# Patient Record
Sex: Male | Born: 1985 | Hispanic: No | Marital: Single | Smoking: Current some day smoker
Health system: Southern US, Community
[De-identification: ages and names within clinical notes are randomized; demographics above are authoritative.]

---

## 2012-04-21 ENCOUNTER — Encounter (HOSPITAL_COMMUNITY): Payer: Self-pay

## 2012-04-21 ENCOUNTER — Emergency Department (INDEPENDENT_AMBULATORY_CARE_PROVIDER_SITE_OTHER)
Admission: EM | Admit: 2012-04-21 | Discharge: 2012-04-21 | Disposition: A | Discharge status: Home / Self Care | Payer: BC Managed Care – PPO | Source: Home / Self Care | Attending: Emergency Medicine | Admitting: Emergency Medicine

## 2012-04-21 DIAGNOSIS — R05 Cough: Secondary | ICD-10-CM

## 2012-04-21 DIAGNOSIS — R0789 Other chest pain: Secondary | ICD-10-CM

## 2012-04-21 DIAGNOSIS — R071 Chest pain on breathing: Secondary | ICD-10-CM

## 2012-04-21 MED ORDER — DEXTROMETHORPHAN HBR 5 MG/5ML PO SYRP: 5.0000 mL | ORAL_SOLUTION | Freq: Two times a day (BID) | ORAL | Status: DC

## 2012-04-21 NOTE — ED Notes (Signed)
C/o cough and cold sx since yesterday.  Reports sharp intermittent lt sided chest pain that started around 5 am today.  Denies pain at present.

## 2012-04-21 NOTE — Discharge Instructions (Signed)
  Your lung and heart exam were unremarkable. Take this medicine  if you continue to cough. If you develop chest pains unrelated to cough or breathing should go to the emergency department immediately when you're having the pain     Chest Pain (Nonspecific) Chest pain has many causes. Your pain could be caused by something serious, such as a heart attack or a blood clot in the lungs. It could also be caused by something less serious, such as a chest bruise or a virus. Follow up with your doctor. More lab tests or other studies may be needed to find the cause of your pain. Most of the time, nonspecific chest pain will improve within 2 to 3 days of rest and mild pain medicine. HOME CARE  For chest bruises, you may put ice on the sore area for 15 to 20 minutes, 3 to 4 times a day. Do this only if it makes you or your child feel better.   Put ice in a plastic bag.   Place a towel between the skin and the bag.   Rest for the next 2 to 3 days.   Go back to work if the pain improves.   See your doctor if the pain lasts longer than 1 to 2 weeks.   Only take medicine as told by your doctor.   Quit smoking if you smoke.  GET HELP RIGHT AWAY IF:   There is more pain or pain that spreads to the arm, neck, jaw, back, or belly (abdomen).   You or your child has shortness of breath.   You or your child coughs more than usual or coughs up blood.   You or your child has very bad back or belly pain, feels sick to his or her stomach (nauseous), or throws up (vomits).   You or your child has very bad weakness.   You or your child passes out (faints).   You or your child has a temperature by mouth above 102 F (38.9 C), not controlled by medicine.  Any of these problems may be serious and may be an emergency. Do not wait to see if the problems will go away. Get medical help right away. Call your local emergency services 911 in [place] Do not drive yourself to the hospital. MAKE SURE YOU:    Understand these instructions.   Will watch this condition.   Will get help right away if you or your child is not doing well or gets worse.  Document Released: 05/27/2008 Document Revised: 11/28/2011 Document Reviewed: 05/27/2008 [facility] Patient Information 2012 [place], [place].

## 2012-04-21 NOTE — ED Provider Notes (Signed)
History     CSN: [id number]  Arrival date & time 04/21/12  1003   First MD Initiated Contact with Patient 04/21/12 1019      Chief Complaint  Patient presents with  . Cough  . Chest Pain    (Consider location/radiation/quality/duration/timing/severity/associated sxs/prior treatment) HPI Comments: Patient presents to urgent care today complaining that this morning he had left-sided chest pain and that he has been coughing since yesterday. He says that when he woke up today at 5 AM he felt this discomfort in the left side of his chest. Taking a deep breath and movement seemed to make it worse but also he felt that at rest. Sore faded away he did not take anything for it it lasted a couple minutes. No further associated symptoms such as nausea, vomiting sweating, dizziness.  Patient describes that he was coughing yesterday heart and try. But that has subsided and he did not take anything for it. He also is requesting a doctor's note for this morning as he did not go to work because of this pain.  Patient is a 26 y.o. male presenting with cough and chest pain.  Cough The current episode started yesterday. The problem has been resolved. The cough is non-productive. Associated symptoms include chest pain. Pertinent negatives include no sweats, no weight loss, no ear congestion, no ear pain, no rhinorrhea, no sore throat, no shortness of breath and no wheezing. He has tried nothing for the symptoms. His past medical history does not include COPD or asthma.  Chest Pain Primary symptoms include cough. Pertinent negatives for primary symptoms include no fever, no fatigue, no shortness of breath, no wheezing and no dizziness.  Pertinent negatives for associated symptoms include no diaphoresis.     History reviewed. No pertinent past medical history.  History reviewed. No pertinent past surgical history.  History reviewed. No pertinent family history.  History  Substance Use Topics  .  Smoking status: Never Smoker   . Smokeless tobacco: Not on file  . Alcohol Use: Yes      Review of Systems  Constitutional: Negative for fever, weight loss, diaphoresis, activity change, appetite change and fatigue.  HENT: Negative for ear pain, sore throat and rhinorrhea.   Respiratory: Positive for cough. Negative for apnea, choking, chest tightness, shortness of breath and wheezing.   Cardiovascular: Positive for chest pain.  Skin: Negative for color change, pallor, rash and wound.  Neurological: Negative for dizziness.    Allergies  Review of patient's allergies indicates no known allergies.  Home Medications   Current Outpatient Rx  Name Route Sig Dispense Refill  . DEXTROMETHORPHAN HBR 5 MG/5ML PO SYRP Oral Take 5 mLs (5 mg total) by mouth 2 times daily at 12 noon and 4 pm. 120 mL 0    BP 115/69  Pulse 88  Temp(Src) 98.5 F (36.9 C) (Oral)  Resp 14  SpO2 100%  Physical Exam  Nursing note and vitals reviewed. Constitutional: He appears well-developed and well-nourished.  HENT:  Head: Normocephalic.  Eyes: Conjunctivae are normal.  Neck: Neck supple.  Cardiovascular: Normal rate, regular rhythm, normal heart sounds and intact distal pulses.  Exam reveals no gallop.   No murmur heard. Pulmonary/Chest: Effort normal and breath sounds normal. No respiratory distress. He has no wheezes. He has no rhonchi. He has no rales. He exhibits tenderness.    Skin: No rash noted.    ED Course  Procedures (including critical care time)  Labs Reviewed - No data to display No  results found.   1. Chest wall discomfort   2. Cough       MDM  Patient is asymptomatic during exam denies any current symptoms. Described that yesterday was coughing and having chest in term attempt discomfort in his left parasternal area. He was sore on his chest this morning when he woke up. Patient very motivated to obtain a work note for today and expressed multiple times that he has no pain  now and the cough has subsided. On exam patient was mildly tender on his right and left upper chest wall area. As depicted in illustration.        Jimmie Molly, MD 04/21/12 [phone]

## 2012-06-01 ENCOUNTER — Emergency Department (INDEPENDENT_AMBULATORY_CARE_PROVIDER_SITE_OTHER)
Admission: EM | Admit: 2012-06-01 | Discharge: 2012-06-01 | Disposition: A | Discharge status: Home / Self Care | Payer: BC Managed Care – PPO | Source: Home / Self Care | Attending: Family Medicine | Admitting: Family Medicine

## 2012-06-01 ENCOUNTER — Encounter (HOSPITAL_COMMUNITY): Payer: Self-pay | Admitting: *Deleted

## 2012-06-01 DIAGNOSIS — L089 Local infection of the skin and subcutaneous tissue, unspecified: Secondary | ICD-10-CM

## 2012-06-01 MED ORDER — SULFAMETHOXAZOLE-TRIMETHOPRIM 800-160 MG PO TABS: 1.0000 | ORAL_TABLET | Freq: Two times a day (BID) | ORAL | Status: AC

## 2012-06-01 NOTE — ED Notes (Signed)
C/O "boil" to right groin since yesterday - "it was really big, but it's getting better".  Also c/o pruritic rash to bilat lower legs since yesterday morning - has appearance of scabbed bug bites.

## 2012-06-01 NOTE — Discharge Instructions (Signed)
Skin Infections A skin infection usually develops as a result of disruption of the skin barrier.  CAUSES  A skin infection might occur following:  Trauma or an injury to the skin such as a cut or insect sting.   Inflammation (as in eczema).   Breaks in the skin between the toes (as in athlete's foot).   Swelling (edema).  SYMPTOMS  The legs are the most common site affected. Usually there is:  Redness.   Swelling.   Pain.   There may be red streaks in the area of the infection.  TREATMENT   Minor skin infections may be treated with topical antibiotics, but if the skin infection is severe, hospital care and intravenous (IV) antibiotic treatment may be needed.   Most often skin infections can be treated with oral antibiotic medicine as well as proper rest and elevation of the affected area until the infection improves.   If you are prescribed oral antibiotics, it is important to take them as directed and to take all the pills even if you feel better before you have finished all of the medicine.   You may apply warm compresses to the area for 20-30 minutes 4 times daily.  You might need a tetanus shot now if:  You have no idea when you had the last one.   You have never had a tetanus shot before.   Your wound had dirt in it.  If you need a tetanus shot and you decide not to get one, there is a rare chance of getting tetanus. Sickness from tetanus can be serious. If you get a tetanus shot, your arm may swell and become red and warm at the shot site. This is common and not a problem. SEEK MEDICAL CARE IF:  The pain and swelling from your infection do not improve within 2 days.  SEEK IMMEDIATE MEDICAL CARE IF:  You develop a fever, chills, or other serious problems.  Document Released: 01/16/2005 Document Revised: 11/28/2011 Document Reviewed: 11/28/2008 [employer] Patient Information 2012 [employer]. 

## 2012-06-01 NOTE — ED Provider Notes (Signed)
History     CSN: [id number]  Arrival date & time 06/01/12  1727   First MD Initiated Contact with Patient 06/01/12 1839      Chief Complaint  Patient presents with  . Abscess  . Rash    (Consider location/radiation/quality/duration/timing/severity/associated sxs/prior treatment) Patient is a 26 y.o. male presenting with abscess and rash. The history is provided by the patient. No language interpreter was used.  Abscess  This is a new problem. The current episode started less than one week ago. The onset was gradual. The problem occurs frequently. The problem has been gradually improving. The abscess is present on the left lower leg and right lower leg. The problem is moderate. The abscess is characterized by itchiness and redness.  Rash   Pt reports he was bitten by something.  Pt complains of multiple bites lower legs.  Pt reports he had a swollen knot in right groin area however knot has gone away  History reviewed. No pertinent past medical history.  History reviewed. No pertinent past surgical history.  No family history on file.  History  Substance Use Topics  . Smoking status: Never Smoker   . Smokeless tobacco: Not on file  . Alcohol Use: Yes     occasional      Review of Systems  Skin: Positive for rash.  All other systems reviewed and are negative.    Allergies  Review of patient's allergies indicates no known allergies.  Home Medications   Current Outpatient Rx  Name Route Sig Dispense Refill  . DEXTROMETHORPHAN HBR 5 MG/5ML PO SYRP Oral Take 5 mLs (5 mg total) by mouth 2 times daily at 12 noon and 4 pm. 120 mL 0    BP 131/89  Pulse 90  Temp(Src) 98 F (36.7 C) (Oral)  Resp 18  SpO2 100%  Physical Exam  Nursing note and vitals reviewed. Constitutional: He is oriented to person, place, and time. He appears well-developed and well-nourished.  HENT:  Head: Normocephalic and atraumatic.  Right Ear: External ear normal.  Left Ear: External ear  normal.  Eyes: Conjunctivae and EOM are normal. Pupils are equal, round, and reactive to light.  Neck: Normal range of motion. Neck supple.  Musculoskeletal: He exhibits tenderness.       Multiple bug bites  Neurological: He is alert and oriented to person, place, and time. He has normal reflexes.  Skin: Rash noted.       Multiple bites,  Erythema,  Pustules on center of some areas  Psychiatric: He has a normal mood and affect.    ED Course  Procedures (including critical care time)  Labs Reviewed - No data to display No results found.   No diagnosis found.    MDM  Rx bactrim Ds x 10 days.        Lonia Skinner [place], [place] 06/01/12 [phone]

## 2012-06-03 NOTE — ED Provider Notes (Signed)
Medical screening examination/treatment/procedure(s) were performed by non-physician practitioner and as supervising physician I was immediately available for consultation/collaboration.   [facility]; MD   Sharin Grave, MD 06/03/12 [phone]

## 2015-06-01 ENCOUNTER — Encounter (HOSPITAL_COMMUNITY): Payer: Self-pay | Admitting: Emergency Medicine

## 2015-06-01 ENCOUNTER — Emergency Department (INDEPENDENT_AMBULATORY_CARE_PROVIDER_SITE_OTHER)
Admission: EM | Admit: 2015-06-01 | Discharge: 2015-06-01 | Disposition: A | Discharge status: Home / Self Care | Payer: Self-pay | Source: Home / Self Care | Attending: Family Medicine | Admitting: Family Medicine

## 2015-06-01 ENCOUNTER — Emergency Department (INDEPENDENT_AMBULATORY_CARE_PROVIDER_SITE_OTHER): Payer: Self-pay

## 2015-06-01 DIAGNOSIS — R0781 Pleurodynia: Secondary | ICD-10-CM

## 2015-06-01 MED ORDER — NAPROXEN 500 MG PO TABS: 500.0000 mg | ORAL_TABLET | Freq: Two times a day (BID) | ORAL | Status: DC

## 2015-06-01 NOTE — Discharge Instructions (Signed)
Thank you for coming in today. Call or go to the emergency room if you get worse, have trouble breathing, have chest pains, or palpitations.  Take naproxen twice daily for pain as needed.   Chest Wall Pain Chest wall pain is pain in or around the bones and muscles of your chest. It may take up to 6 weeks to get better. It may take longer if you must stay physically active in your work and activities.  CAUSES  Chest wall pain may happen on its own. However, it may be caused by:  A viral illness like the flu.  Injury.  Coughing.  Exercise.  Arthritis.  Fibromyalgia.  Shingles. HOME CARE INSTRUCTIONS   Avoid overtiring physical activity. Try not to strain or perform activities that cause pain. This includes any activities using your chest or your abdominal and side muscles, especially if heavy weights are used.  Put ice on the sore area.  Put ice in a plastic bag.  Place a towel between your skin and the bag.  Leave the ice on for 15-20 minutes per hour while awake for the first 2 days.  Only take over-the-counter or prescription medicines for pain, discomfort, or fever as directed by your caregiver. SEEK IMMEDIATE MEDICAL CARE IF:   Your pain increases, or you are very uncomfortable.  You have a fever.  Your chest pain becomes worse.  You have new, unexplained symptoms.  You have nausea or vomiting.  You feel sweaty or lightheaded.  You have a cough with phlegm (sputum), or you cough up blood. MAKE SURE YOU:   Understand these instructions.  Will watch your condition.  Will get help right away if you are not doing well or get worse. Document Released: 12/09/2005 Document Revised: 03/02/2012 Document Reviewed: 08/05/2011 [facility] Patient Information 2015 [place], [place]. This information is not intended to replace advice given to you by your health care provider. Make sure you discuss any questions you have with your health care provider.

## 2015-06-01 NOTE — ED Provider Notes (Signed)
Robert Wolf is a 29 y.o. male who presents to Urgent Care today for left lateral chest pain present for one to 2 weeks. Pain is present in the lateral ribs and worse with deep inspiration cough and arm motion. He denies any injury. No fevers or chills nausea vomiting or diarrhea. No treatments tried yet. Patient works as a Financial risk analyst.    History reviewed. No pertinent past medical history. History reviewed. No pertinent past surgical history. History  Substance Use Topics  . Smoking status: Never Smoker   . Smokeless tobacco: Not on file  . Alcohol Use: Yes     Comment: occasional   ROS as above Medications: No current facility-administered medications for this encounter.   Current Outpatient Prescriptions  Medication Sig Dispense Refill  . Dextromethorphan HBr 5 MG/5ML SYRP Take 5 mLs (5 mg total) by mouth 2 times daily at 12 noon and 4 pm. 120 mL 0  . naproxen (NAPROSYN) 500 MG tablet Take 1 tablet (500 mg total) by mouth 2 (two) times daily. 30 tablet 0   No Known Allergies   Exam:  BP 125/78 mmHg  Pulse 81  Temp(Src) 99.1 F (37.3 C) (Oral)  Resp 16  SpO2 100% Gen: Well NAD HEENT: EOMI,  MMM Lungs: Normal work of breathing. CTABL Heart: RRR no MRG Chest wall: Tender palpation left lateral chest wall Abd: NABS, Soft. Nondistended, Nontender Exts: Brisk capillary refill, warm and well perfused.   No results found for this or any previous visit (from the past 24 hour(s)). Dg Ribs Unilateral W/chest Left  06/01/2015   CLINICAL DATA:  Left rib pain for weeks.  No known injury.  EXAM: LEFT RIBS AND CHEST - 3+ VIEW  COMPARISON:  None.  FINDINGS: No fracture or other bone lesions are seen involving the ribs. There is no evidence of pneumothorax or pleural effusion. Both lungs are clear. Heart size and mediastinal contours are within normal limits.  IMPRESSION: Negative.   Electronically Signed   By: Elige Ko   On: 06/01/2015 14:34    Assessment and Plan: 29 y.o. male with left  rib pain. Tx with naproxen and time. F/u PRN.   Discussed warning signs or symptoms. Please see discharge instructions. Patient expresses understanding.     Rodolph Bong, MD 06/01/15 [phone]

## 2015-06-01 NOTE — ED Notes (Signed)
Pt c/o left rib cage pain x1.5 weeks; last couple of days have pain has been worse Pain increases w/activity and deep breaths  Alert, no signs of acute distress.

## 2016-01-17 ENCOUNTER — Other Ambulatory Visit (HOSPITAL_COMMUNITY)
Admission: RE | Admit: 2016-01-17 | Discharge: 2016-01-17 | Disposition: A | Discharge status: Home / Self Care | Payer: Self-pay | Source: Ambulatory Visit | Attending: Family Medicine | Admitting: Family Medicine

## 2016-01-17 ENCOUNTER — Emergency Department (INDEPENDENT_AMBULATORY_CARE_PROVIDER_SITE_OTHER)
Admission: EM | Admit: 2016-01-17 | Discharge: 2016-01-17 | Disposition: A | Discharge status: Home / Self Care | Payer: Self-pay | Source: Home / Self Care | Attending: Family Medicine | Admitting: Family Medicine

## 2016-01-17 ENCOUNTER — Encounter (HOSPITAL_COMMUNITY): Payer: Self-pay | Admitting: *Deleted

## 2016-01-17 DIAGNOSIS — J111 Influenza due to unidentified influenza virus with other respiratory manifestations: Secondary | ICD-10-CM

## 2016-01-17 DIAGNOSIS — R69 Illness, unspecified: Principal | ICD-10-CM

## 2016-01-17 LAB — POCT RAPID STREP A: STREPTOCOCCUS, GROUP A SCREEN (DIRECT): NEGATIVE

## 2016-01-17 NOTE — ED Provider Notes (Signed)
CSN: [id number]     Arrival date & time 01/17/16  1354 History   First MD Initiated Contact with Patient 01/17/16 1551     Chief Complaint  Patient presents with  . Fever   (Consider location/radiation/quality/duration/timing/severity/associated sxs/prior Treatment) Patient is a 30 y.o. male presenting with fever. The history is provided by the patient.  Fever Temp source:  Subjective Severity:  Moderate Onset quality:  Sudden Duration:  1 day Progression:  Unchanged Chronicity:  New Relieved by:  None tried Worsened by:  Nothing tried Ineffective treatments:  None tried Associated symptoms: chills, congestion, cough, myalgias, nausea, rhinorrhea, sore throat and vomiting   Associated symptoms: no diarrhea and no ear pain   Risk factors: no recent travel     History reviewed. No pertinent past medical history. History reviewed. No pertinent past surgical history. History reviewed. No pertinent family history. Social History  Substance Use Topics  . Smoking status: Never Smoker   . Smokeless tobacco: None  . Alcohol Use: Yes     Comment: occasional    Review of Systems  Constitutional: Positive for fever, chills, appetite change and fatigue.  HENT: Positive for congestion, rhinorrhea and sore throat. Negative for ear pain.   Respiratory: Positive for cough. Negative for shortness of breath and wheezing.   Cardiovascular: Negative.   Gastrointestinal: Positive for nausea and vomiting. Negative for abdominal pain, diarrhea, constipation and blood in stool.  Musculoskeletal: Positive for myalgias.  All other systems reviewed and are negative.   Allergies  Review of patient's allergies indicates no known allergies.  Home Medications   Prior to Admission medications   Medication Sig Start Date End Date Taking? Authorizing Provider  Dextromethorphan HBr 5 MG/5ML SYRP Take 5 mLs (5 mg total) by mouth 2 times daily at 12 noon and 4 pm. 04/21/12   Jimmie Molly, MD  naproxen  (NAPROSYN) 500 MG tablet Take 1 tablet (500 mg total) by mouth 2 (two) times daily. 06/01/15   Rodolph Bong, MD   Meds Ordered and Administered this Visit  Medications - No data to display  BP 110/70 mmHg  Pulse 88  Temp(Src) 100.2 F (37.9 C) (Oral)  Resp 18  SpO2 100% No data found.   Physical Exam  Constitutional: He is oriented to person, place, and time. He appears well-developed and well-nourished. No distress.  HENT:  Right Ear: Ear canal normal. Tympanic membrane is erythematous and bulging. Tympanic membrane mobility is abnormal.  Left Ear: Ear canal normal. Tympanic membrane is erythematous and bulging. Tympanic membrane mobility is abnormal.  Mouth/Throat: No oropharyngeal exudate.  Neck: Normal range of motion. Neck supple.  Pulmonary/Chest: Effort normal and breath sounds normal.  Abdominal: Soft. Bowel sounds are normal. He exhibits no distension. There is no tenderness. There is no rebound and no guarding.  Musculoskeletal: Normal range of motion.  Lymphadenopathy:    He has no cervical adenopathy.  Neurological: He is alert and oriented to person, place, and time.  Skin: Skin is warm and dry.  Nursing note and vitals reviewed.   ED Course  Procedures (including critical care time)  Labs Review Labs Reviewed  CULTURE, GROUP A STREP [facility])  POCT RAPID STREP A   Strep neg  Imaging Review No results found.   Visual Acuity Review  Right Eye Distance:   Left Eye Distance:   Bilateral Distance:    Right Eye Near:   Left Eye Near:    Bilateral Near:  MDM   1. Influenza-like illness    No orders of the defined types were placed in this encounter.       Linna Hoff, MD 01/19/16 2038

## 2016-01-17 NOTE — Discharge Instructions (Signed)
Drink plenty of fluids as discussed, ,  mucinex or delsym for cough. Return or see your doctor if further problems

## 2016-01-17 NOTE — ED Notes (Signed)
Pt  Reports  Symptoms  Of  Cough     Chills   Fever  And  Body  Aches         Vomited  Last  Pm

## 2016-01-20 LAB — CULTURE, GROUP A STREP (THRC)

## 2017-01-22 IMAGING — DX DG RIBS W/ CHEST 3+V*L*
3 series · 3 of 3 positions shown · non-contrast
Comparison: None.

CLINICAL DATA: Left rib pain for weeks.  No known injury.

EXAM:
LEFT RIBS AND CHEST - 3+ VIEW

[chest pa]
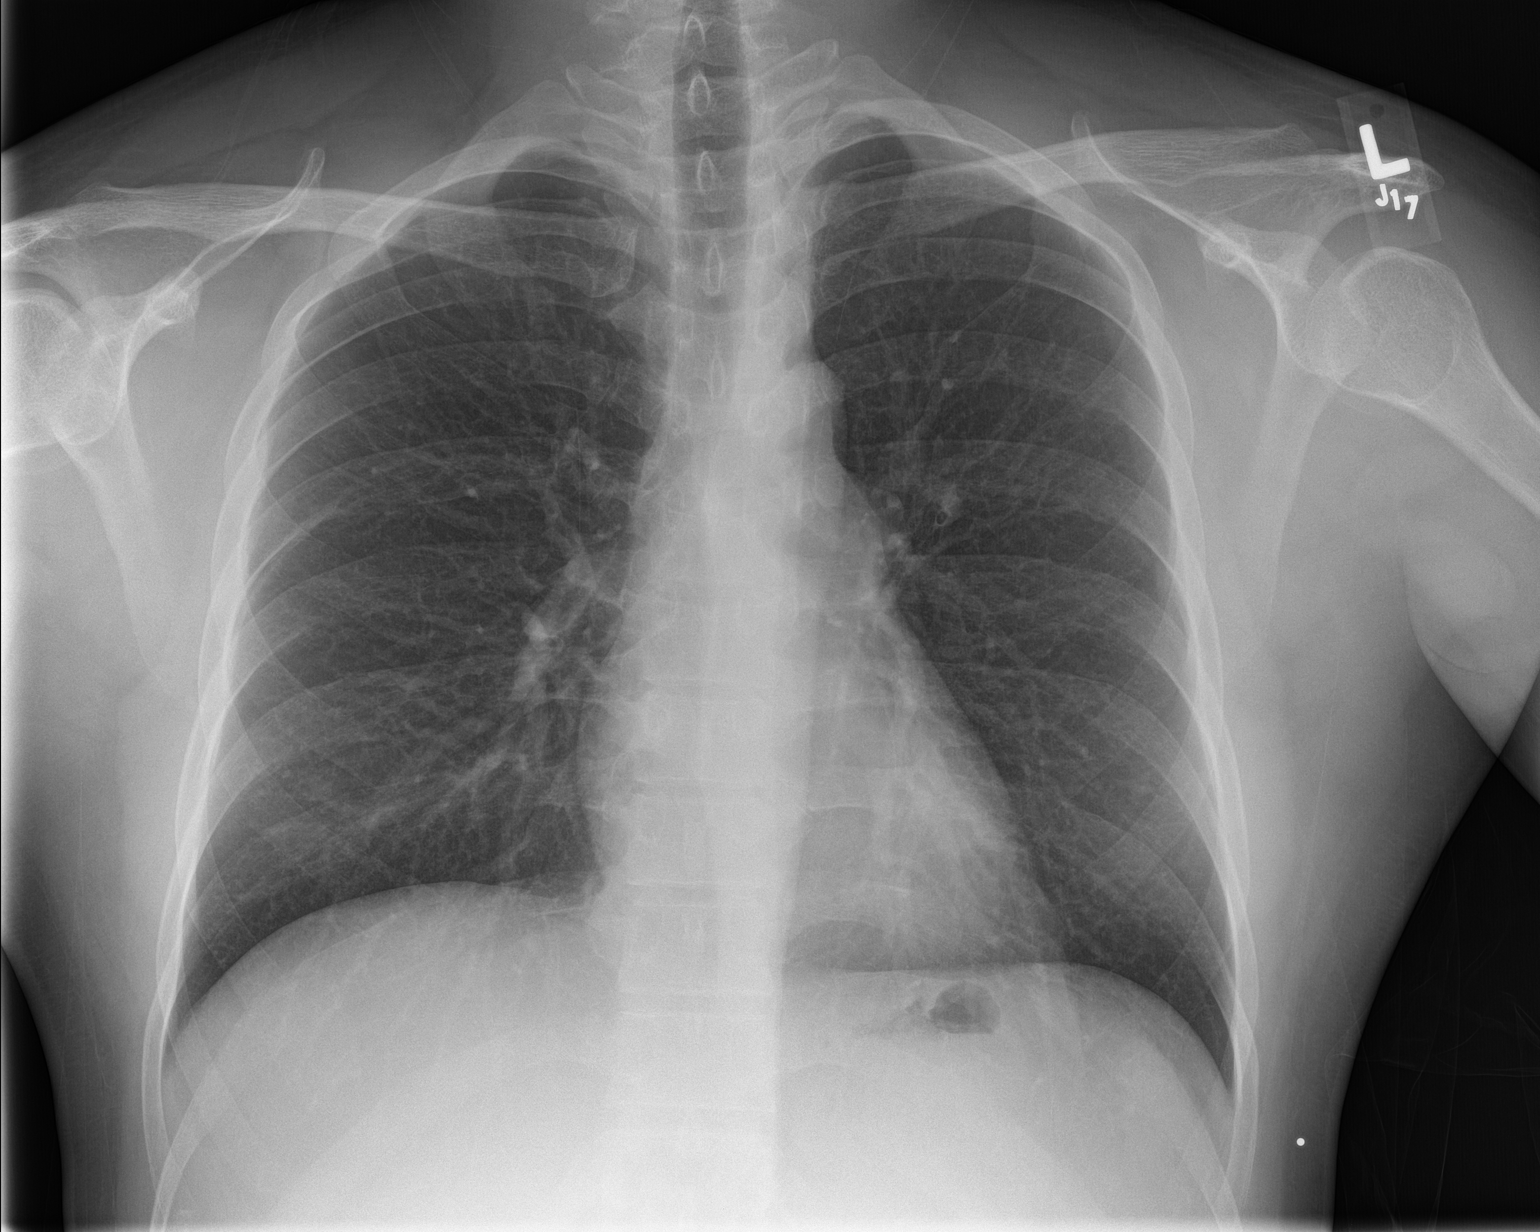

[rib pa]
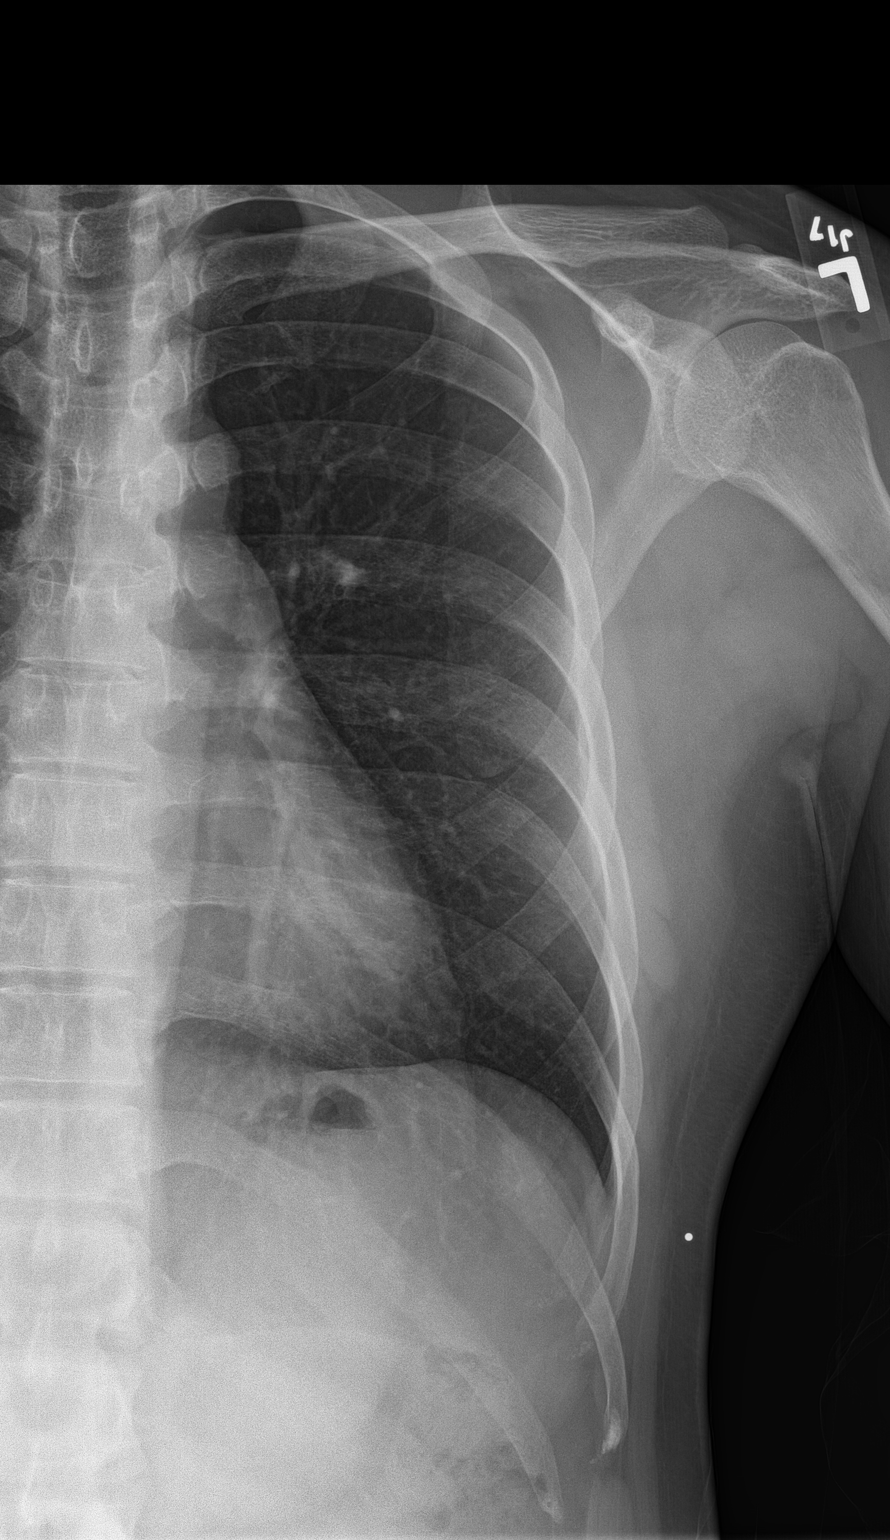

[rib obl]
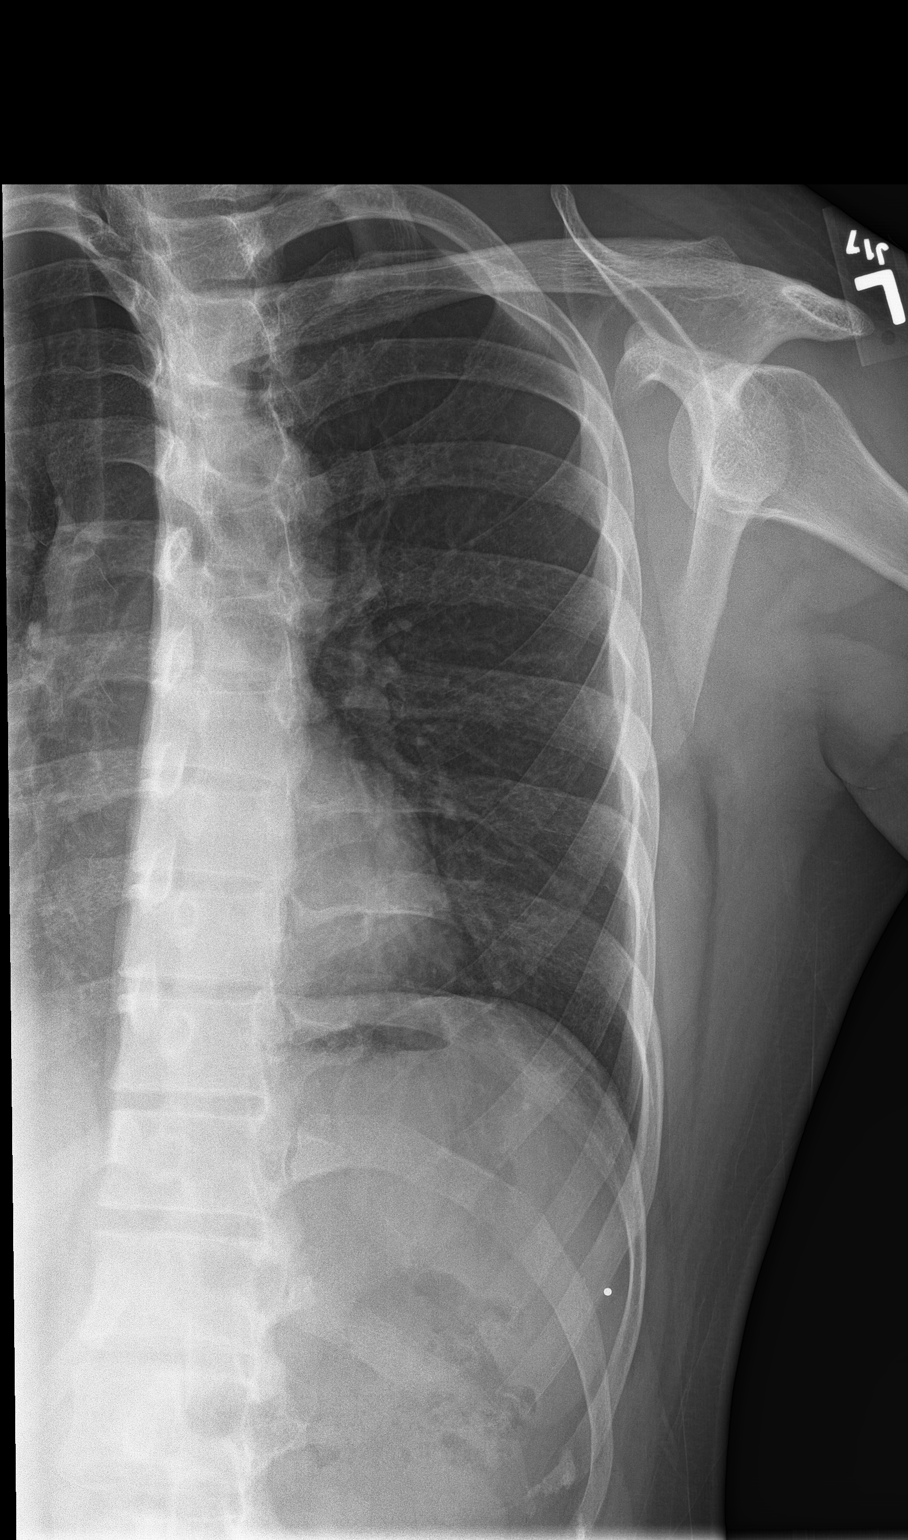

[3 of 3 positions shown; findings below may reference images not displayed]

FINDINGS: No fracture or other bone lesions are seen involving the ribs. There
is no evidence of pneumothorax or pleural effusion. Both lungs are
clear. Heart size and mediastinal contours are within normal limits.
IMPRESSION: Negative.

## 2017-08-25 ENCOUNTER — Encounter (HOSPITAL_COMMUNITY): Payer: Self-pay | Admitting: Emergency Medicine

## 2017-08-25 ENCOUNTER — Ambulatory Visit (HOSPITAL_COMMUNITY)
Admission: EM | Admit: 2017-08-25 | Discharge: 2017-08-25 | Disposition: A | Discharge status: Home / Self Care | Payer: Self-pay | Attending: Family Medicine | Admitting: Family Medicine

## 2017-08-25 DIAGNOSIS — N454 Abscess of epididymis or testis: Secondary | ICD-10-CM

## 2017-08-25 DIAGNOSIS — M79604 Pain in right leg: Secondary | ICD-10-CM

## 2017-08-25 DIAGNOSIS — L02415 Cutaneous abscess of right lower limb: Secondary | ICD-10-CM

## 2017-08-25 DIAGNOSIS — L02214 Cutaneous abscess of groin: Secondary | ICD-10-CM

## 2017-08-25 MED ORDER — CEFTRIAXONE SODIUM 1 G IJ SOLR
1.0000 g | Freq: Once | INTRAMUSCULAR | Status: AC
  Administered 2017-08-25: 1 g via INTRAMUSCULAR

## 2017-08-25 MED ORDER — CEFTRIAXONE SODIUM 1 G IJ SOLR
INTRAMUSCULAR | Status: AC
  Filled 2017-08-25: qty 10

## 2017-08-25 MED ORDER — SULFAMETHOXAZOLE-TRIMETHOPRIM 800-160 MG PO TABS: 1.0000 | ORAL_TABLET | Freq: Two times a day (BID) | ORAL | 0 refills | Status: AC

## 2017-08-25 MED ORDER — LIDOCAINE HCL (PF) 1 % IJ SOLN
INTRAMUSCULAR | Status: AC
  Filled 2017-08-25: qty 2

## 2017-08-25 MED ORDER — HYDROCODONE-ACETAMINOPHEN 5-325 MG PO TABS: 2.0000 | ORAL_TABLET | ORAL | 0 refills | Status: DC | PRN

## 2017-08-25 NOTE — ED Provider Notes (Signed)
  [name]   [id number] 08/25/17 Arrival Time: 1655  ASSESSMENT & PLAN:  1. Abscess of right leg   2. Abscess of right groin   3. Right leg pain   4. Testicular abscess     Meds ordered this encounter  Medications  . cefTRIAXone (ROCEPHIN) injection 1 g  . HYDROcodone-acetaminophen (NORCO/VICODIN) 5-325 MG tablet    Sig: Take 2 tablets by mouth every 4 (four) hours as needed.    Dispense:  6 tablet    Refill:  0    Order Specific Question:   Supervising Provider    Answer:   Eustace MooreMURRAY, LAURA W [[id number]]  . sulfamethoxazole-trimethoprim (BACTRIM DS,SEPTRA DS) 800-160 MG tablet    Sig: Take 1 tablet by mouth 2 (two) times daily.    Dispense:  20 tablet    Refill:  0    Order Specific Question:   Supervising Provider    Answer:   Eustace MooreMURRAY, LAURA W [[id number]]  Patient requests not to have I&D procedure and will tx with abx's first.  Follow up prn if not better.  Reviewed expectations re: course of current medical issues. Questions answered. Outlined signs and symptoms indicating need for more acute intervention. Patient verbalized understanding. After Visit Summary given.   SUBJECTIVE:  Robert Wolf is a 31 y.o. male who presents with complaint of abscess right inner thigh and right posterior thigh, right groin, and bottom of testicle.  ROS: As per HPI.   OBJECTIVE:  Vitals:   08/25/17 1750  BP: 130/82  Pulse: 93  Temp: 98.9 F (37.2 C)  TempSrc: Oral  SpO2: 100%    General appearance: alert; no distress Eyes: PERRLA; EOMI; conjunctiva normal Lungs: clear to auscultation bilaterally Heart: regular rate and rhythm Abdomen: soft, non-tender; bowel sounds normal; no masses or organomegaly; no guarding or rebound tenderness Back: no CVA tenderness Extremities: no cyanosis or edema; symmetrical with no gross deformities Skin: nonfluctuant abscess right groin, right inner thigh, and testicle.  Posterior thigh with cellulitis and draining  abscess. Labs: Results for orders placed or performed during the hospital encounter of 01/17/16  Culture, group A strep  Result Value Ref Range   Specimen Description THROAT    Special Requests NONE    Culture NO GROUP A STREP (S.PYOGENES) ISOLATED    Report Status 01/20/2016 FINAL   POCT rapid strep A [facility])  Result Value Ref Range   Streptococcus, Group A Screen (Direct) NEGATIVE NEGATIVE   Labs Reviewed - No data to display  Imaging: No results found.  No Known Allergies  History reviewed. No pertinent past medical history. Social History   Social History  . Marital status: Single    Spouse name: N/A  . Number of children: N/A  . Years of education: N/A   Occupational History  . Not on file.   Social History Main Topics  . Smoking status: Never Smoker  . Smokeless tobacco: Current User    Types: Chew  . Alcohol use Yes     Comment: occasional  . Drug use: No  . Sexual activity: Not on file   Other Topics Concern  . Not on file   Social History Narrative  . No narrative on file   History reviewed. No pertinent family history. History reviewed. No pertinent surgical history.   [name], [name], [name] 08/25/17 1843

## 2017-08-25 NOTE — ED Triage Notes (Signed)
Pt has two abscesses on his right leg.  One on the medial thigh and one on the back of the thigh.  He has a third developing on his right hip.  He denies drainage from the abscesses, but reports a lot of pain.

## 2019-08-15 ENCOUNTER — Other Ambulatory Visit: Payer: Self-pay

## 2019-08-15 ENCOUNTER — Encounter (HOSPITAL_COMMUNITY): Payer: Self-pay | Admitting: *Deleted

## 2019-08-15 ENCOUNTER — Ambulatory Visit (HOSPITAL_COMMUNITY)
Admission: EM | Admit: 2019-08-15 | Discharge: 2019-08-15 | Disposition: A | Discharge status: Home / Self Care | Payer: Self-pay | Attending: Family Medicine | Admitting: Family Medicine

## 2019-08-15 DIAGNOSIS — L2489 Irritant contact dermatitis due to other agents: Secondary | ICD-10-CM

## 2019-08-15 MED ORDER — PREDNISONE 50 MG PO TABS: 50.0000 mg | ORAL_TABLET | Freq: Every day | ORAL | 0 refills | Status: DC

## 2019-08-15 NOTE — Discharge Instructions (Signed)
Start prednisone as directed. Ice compress to the rash. Avoid hot water. Monitor for spreading redness, increased warmth, pain, follow up for reevaluation needed.

## 2019-08-15 NOTE — ED Provider Notes (Signed)
[facility]    CSN: [id number] Arrival date & time: 08/15/19  1335      History   Chief Complaint Chief Complaint  Patient presents with  . Hair/Scalp Problem    HPI Robert Wolf is a 33 y.o. male.   33 year old male comes in for 3-4 day history of rash to the face, scalp, bilateral upper and lower extremities.  Patient was cleaning the yard prior to symptom onset.  States he was using a machete to cut down trees/ivy.  Denies other new contacts such as changes in hygiene products, changes in medications.  He has rash to bilateral lower and upper extremities, the face, and partial scalp.  Area is itching in nature with swelling, erythema.  Denies pain.  Denies spreading erythema, warmth, fever.  States woke up this morning with swelling to bilateral eyelids that has since resolved.  Has not tried anything for the symptoms.     History reviewed. No pertinent past medical history.  There are no active problems to display for this patient.   History reviewed. No pertinent surgical history.     Home Medications    Prior to Admission medications   Medication Sig Start Date End Date Taking? Authorizing Provider  predniSONE (DELTASONE) 50 MG tablet Take 1 tablet (50 mg total) by mouth daily. 08/15/19   Belinda FisherYu, Dashia Caldeira V, PA-C    Family History Family History  Problem Relation Age of Onset  . Diabetes Father     Social History Social History   Tobacco Use  . Smoking status: Current Some Day Smoker  . Smokeless tobacco: Current User    Types: Chew  Substance Use Topics  . Alcohol use: Yes    Comment: occasional  . Drug use: No     Allergies   Patient has no known allergies.   Review of Systems Review of Systems  Reason unable to perform ROS: See HPI as above.     Physical Exam Triage Vital Signs ED Triage Vitals  Enc Vitals Group     BP 08/15/19 1435 121/85     Pulse Rate 08/15/19 1435 77     Resp 08/15/19 1435 16     Temp 08/15/19 1435 99 F  (37.2 C)     Temp src --      SpO2 08/15/19 1435 97 %     Weight --      Height --      Head Circumference --      Peak Flow --      Pain Score 08/15/19 1436 0     Pain Loc --      Pain Edu? --      Excl. in GC? --    No data found.  Updated Vital Signs BP 121/85   Pulse 77   Temp 99 F (37.2 C)   Resp 16   SpO2 97%   Physical Exam Constitutional:      General: He is not in acute distress.    Appearance: He is well-developed. He is not diaphoretic.  HENT:     Head: Normocephalic and atraumatic.  Eyes:     General: Lids are normal.     Extraocular Movements: Extraocular movements intact.     Conjunctiva/sclera: Conjunctivae normal.     Pupils: Pupils are equal, round, and reactive to light.  Pulmonary:     Effort: Pulmonary effort is normal. No respiratory distress.  Skin:    General: Skin is warm and dry.  Comments: Diffuse maculopapular rash to the face with linear rash to the left scalp. Similar rash to the upper and lower extremities. Surrounding erythema, no warmth, no pain. No vesicles, fluctuance.   Neurological:     Mental Status: He is alert and oriented to person, place, and time.      UC Treatments / Results  Labs (all labs ordered are listed, but only abnormal results are displayed) Labs Reviewed - No data to display  EKG   Radiology No results found.  Procedures Procedures (including critical care time)  Medications Ordered in UC Medications - No data to display  Initial Impression / Assessment and Plan / UC Course  I have reviewed the triage vital signs and the nursing notes.  Pertinent labs & imaging results that were available during my care of the patient were reviewed by me and considered in my medical decision making (see chart for details).    History and exam most consistent with contact dermatitis.  Will start prednisone as directed.  Patient is to take antihistamine for further symptomatic relief.  Return precautions given.   Patient expresses understanding and agrees to plan.  Final Clinical Impressions(s) / UC Diagnoses   Final diagnoses:  Irritant contact dermatitis due to other agents    ED Prescriptions    Medication Sig Dispense Auth. Provider   predniSONE (DELTASONE) 50 MG tablet Take 1 tablet (50 mg total) by mouth daily. 5 tablet Tobin Chad, [place] 08/15/19 1527

## 2019-08-15 NOTE — ED Triage Notes (Signed)
C/O pruritis and inflammation with rash to scalp, face, bilat lower legs and BUE x 3-4 days.

## 2022-06-25 ENCOUNTER — Encounter (HOSPITAL_COMMUNITY): Payer: Self-pay

## 2022-06-25 ENCOUNTER — Ambulatory Visit (HOSPITAL_COMMUNITY)
Admission: EM | Admit: 2022-06-25 | Discharge: 2022-06-25 | Disposition: A | Discharge status: Home / Self Care | Payer: Self-pay | Attending: Internal Medicine | Admitting: Internal Medicine

## 2022-06-25 DIAGNOSIS — S39011A Strain of muscle, fascia and tendon of abdomen, initial encounter: Secondary | ICD-10-CM

## 2022-06-25 MED ORDER — NAPROXEN 500 MG PO TABS: 500.0000 mg | ORAL_TABLET | Freq: Two times a day (BID) | ORAL | 0 refills | Status: DC

## 2022-06-25 NOTE — Discharge Instructions (Addendum)
Take naproxen twice daily for the next 2 to 3 days to treat your abdominal muscle strain.  After 2 to 3 days of consistent use, you may use this as needed.  Take this medication with food to avoid GI upset.  If you develop any new or worsening symptoms or do not improve in the next 2 to 3 days, please return.  If your symptoms are severe, please go to the emergency room.  Follow-up with your primary care provider for further evaluation and management of your symptoms as well as ongoing wellness visits.  I hope you feel better!

## 2022-06-25 NOTE — ED Triage Notes (Signed)
Onset of pain this morning while doing yard work. Bilateral flank pain radiating into the lower left chest/rib.   No known injuries, no falls. No urinary symptoms. No heavy lifting. Patient was bending over when he felt the pain start.

## 2022-06-25 NOTE — ED Provider Notes (Addendum)
[facility]    CSN: [id number] Arrival date & time: 06/25/22  1418      History   Chief Complaint Chief Complaint  Patient presents with   Chest Pain   Flank Pain    HPI Robert Wolf is a 36 y.o. male.   Patient presents to urgent care for evaluation of his side pain since yesterday.  Pain initially started to his right side then migrated to his left side.  Patient states that he was bent over working in the yard for an extended period of time and believes that this is the cause to his pain.  He denies lifting heavy objects recently and significant change in physical activity.  Pain comes and goes without known trigger.  Patient suspects that the pain is worsened by movement.  He has not attempted use of any over-the-counter medications prior to arrival at urgent care for his pain.  Denies urinary frequency, dysuria, odor, hesitancy, urgency, back pain, and fever/chills.  Last bowel movement was today and normal.  No penile discharge reported. He is normally very active and plays softball. Denies recent trauma/injury/bruising to abdomen, flanks, or oblique muscles. Denies chest pain, shortness of breath, dizziness, nausea, vomiting, diarrhea, and numbness/tingling to bilateral upper and lower extremities.  Patient denies tenderness and pain at this time to anywhere in his body.   Chest Pain Flank Pain Associated symptoms include chest pain.    History reviewed. No pertinent past medical history.  There are no problems to display for this patient.   History reviewed. No pertinent surgical history.     Home Medications    Prior to Admission medications   Medication Sig Start Date End Date Taking? Authorizing Provider  naproxen (NAPROSYN) 500 MG tablet Take 1 tablet (500 mg total) by mouth 2 (two) times daily. 06/25/22  Yes Carrie Usery, Donavan Burnet, FNP    Family History Family History  Problem Relation Age of Onset   Diabetes Father     Social History Social  History   Tobacco Use   Smoking status: Some Days   Smokeless tobacco: Current    Types: Chew  Vaping Use   Vaping Use: Never used  Substance Use Topics   Alcohol use: Yes    Comment: occasional   Drug use: No     Allergies   Patient has no known allergies.   Review of Systems Review of Systems  Cardiovascular:  Positive for chest pain.  Genitourinary:  Positive for flank pain.  Per HPI   Physical Exam Triage Vital Signs ED Triage Vitals  Enc Vitals Group     BP 06/25/22 1433 134/87     Pulse Rate 06/25/22 1433 85     Resp 06/25/22 1433 16     Temp 06/25/22 1433 98.9 F (37.2 C)     Temp Source 06/25/22 1433 Oral     SpO2 06/25/22 1433 100 %     Weight 06/25/22 1435 173 lb (78.5 kg)     Height 06/25/22 1435 5\' 5"  (1.651 m)     Head Circumference --      Peak Flow --      Pain Score 06/25/22 1435 9     Pain Loc --      Pain Edu? --      Excl. in GC? --    No data found.  Updated Vital Signs BP 134/87 (BP Location: Left Arm)   Pulse 85   Temp 98.9 F (37.2 C) (Oral)   Resp  16   Ht 5\' 5"  (1.651 m)   Wt 173 lb (78.5 kg)   SpO2 100%   BMI 28.79 kg/m   Visual Acuity Right Eye Distance:   Left Eye Distance:   Bilateral Distance:    Right Eye Near:   Left Eye Near:    Bilateral Near:     Physical Exam Vitals and nursing note reviewed.  Constitutional:      Appearance: Normal appearance. He is not ill-appearing or toxic-appearing.     Comments: Very pleasant patient sitting on exam in position of comfort table in no acute distress.   HENT:     Head: Normocephalic and atraumatic.     Right Ear: Hearing and external ear normal.     Left Ear: Hearing and external ear normal.     Nose: Nose normal.     Mouth/Throat:     Lips: Pink.     Mouth: Mucous membranes are moist.  Eyes:     General: Lids are normal. Vision grossly intact. Gaze aligned appropriately.     Extraocular Movements: Extraocular movements intact.     Conjunctiva/sclera:  Conjunctivae normal.  Cardiovascular:     Rate and Rhythm: Normal rate and regular rhythm.     Heart sounds: Normal heart sounds, S1 normal and S2 normal.  Pulmonary:     Effort: Pulmonary effort is normal. No respiratory distress.     Breath sounds: Normal breath sounds and air entry.  Abdominal:     General: Bowel sounds are normal.     Palpations: Abdomen is soft.     Tenderness: There is no abdominal tenderness. There is no guarding.  Musculoskeletal:        General: No swelling or tenderness. Normal range of motion.     Cervical back: Normal range of motion and neck supple.     Right lower leg: No edema.     Left lower leg: No edema.     Comments: No tenderness to palpation of chest wall, abdomen, paraspinal muscles of C, T, and L. No bony/spinal tenderness to C, T, and L spine. Non tender to palpation of the bilateral oblique muscles. Range of motion with forward and bilateral flexion at the hips is not limited by tenderness. Patient able to bend forward and touch his toes without difficulty and without tenderness. He is neurovascularly intact to distal extremities. No color change to skin.  5/5 grip strength to bilateral upper extremities.  5/5 strength with abduction and abduction of bilateral upper and lower extremities.    Lymphadenopathy:     Cervical: No cervical adenopathy.  Skin:    General: Skin is warm and dry.     Capillary Refill: Capillary refill takes less than 2 seconds.     Findings: No rash.  Neurological:     General: No focal deficit present.     Mental Status: He is alert and oriented to person, place, and time. Mental status is at baseline.     Cranial Nerves: No dysarthria or facial asymmetry.     Motor: No weakness.     Gait: Gait is intact. Gait normal.  Psychiatric:        Mood and Affect: Mood normal.        Speech: Speech normal.        Behavior: Behavior normal.        Thought Content: Thought content normal.        Judgment: Judgment normal.       UC Treatments /  Results  Labs (all labs ordered are listed, but only abnormal results are displayed) Labs Reviewed - No data to display  EKG   Radiology No results found.  Procedures Procedures (including critical care time)  Medications Ordered in UC Medications - No data to display  Initial Impression / Assessment and Plan / UC Course  I have reviewed the triage vital signs and the nursing notes.  Pertinent labs & imaging results that were available during my care of the patient were reviewed by me and considered in my medical decision making (see chart for details).  1.  Strain of abdominal muscle Suspect that patient has strained his oblique muscles of his abdomen.  No peritoneal signs elicited to abdominal exam.  Unable to elicit pain with palpation to back and abdomen.  No CVA tenderness bilaterally.  Plan to treat with naproxen twice daily for the next 2 to 3 days, then as needed.  He may use heat and gentle range of motion exercises to help with the pain as well.  Paper prescription given for naproxen and good Rx coupon provided.  Advised patient to take this medication with food to avoid GI upset.  Discussed physical exam and available lab work findings in clinic with patient.  Counseled patient regarding appropriate use of medications and potential side effects for all medications recommended or prescribed today. Discussed red flag signs and symptoms of worsening condition,when to call the PCP office, return to urgent care, and when to seek higher level of care in the emergency department. Patient verbalizes understanding and agreement with plan. All questions answered. Patient discharged in stable condition.   Final Clinical Impressions(s) / UC Diagnoses   Final diagnoses:  Strain of abdominal muscle, initial encounter     Discharge Instructions      Take naproxen twice daily for the next 2 to 3 days to treat your abdominal muscle strain.  After 2 to 3 days  of consistent use, you may use this as needed.  Take this medication with food to avoid GI upset.  If you develop any new or worsening symptoms or do not improve in the next 2 to 3 days, please return.  If your symptoms are severe, please go to the emergency room.  Follow-up with your primary care provider for further evaluation and management of your symptoms as well as ongoing wellness visits.  I hope you feel better!    ED Prescriptions     Medication Sig Dispense Auth. Provider   naproxen (NAPROSYN) 500 MG tablet Take 1 tablet (500 mg total) by mouth 2 (two) times daily. 30 tablet Talbot Grumbling, FNP      PDMP not reviewed this encounter.   Talbot Grumbling, [place] 06/25/22 [place], [place], FNP 06/25/22 1549

## 2022-10-04 ENCOUNTER — Ambulatory Visit (HOSPITAL_COMMUNITY)
Admission: EM | Admit: 2022-10-04 | Discharge: 2022-10-04 | Disposition: A | Discharge status: Home / Self Care | Payer: Self-pay | Attending: Family Medicine | Admitting: Family Medicine

## 2022-10-04 ENCOUNTER — Ambulatory Visit (INDEPENDENT_AMBULATORY_CARE_PROVIDER_SITE_OTHER): Payer: Self-pay

## 2022-10-04 DIAGNOSIS — R079 Chest pain, unspecified: Secondary | ICD-10-CM

## 2022-10-04 DIAGNOSIS — R091 Pleurisy: Secondary | ICD-10-CM

## 2022-10-04 DIAGNOSIS — J029 Acute pharyngitis, unspecified: Secondary | ICD-10-CM

## 2022-10-04 LAB — POCT RAPID STREP A, ED / UC: Streptococcus, Group A Screen (Direct): NEGATIVE

## 2022-10-04 MED ORDER — KETOROLAC TROMETHAMINE 30 MG/ML IJ SOLN
INTRAMUSCULAR | Status: AC
  Filled 2022-10-04: qty 1

## 2022-10-04 MED ORDER — KETOROLAC TROMETHAMINE 30 MG/ML IJ SOLN
30.0000 mg | Freq: Once | INTRAMUSCULAR | Status: AC
  Administered 2022-10-04: 30 mg via INTRAMUSCULAR

## 2022-10-04 MED ORDER — PREDNISONE 20 MG PO TABS: 40.0000 mg | ORAL_TABLET | Freq: Every day | ORAL | 0 refills | Status: AC

## 2022-10-04 NOTE — ED Provider Notes (Signed)
[facility]    CSN: [id number] Arrival date & time: 10/04/22  1414      History   Chief Complaint No chief complaint on file.   HPI Robert Wolf is a 36 y.o. male.   HPI Here for a 2-day history of anterior central chest pain.  It does worsen with deep breathing.  He also notes some pain in his right throat.  No fever and no cough or congestion.  No vomiting or diarrhea  He does note he chews tobacco, and he fell asleep apparently with the water in his mouth sometime prior to the symptoms beginning.  He is concerned that he swallowed the tobacco  No past medical history on file.  There are no problems to display for this patient.   No past surgical history on file.     Home Medications    Prior to Admission medications   Medication Sig Start Date End Date Taking? Authorizing Provider  predniSONE (DELTASONE) 20 MG tablet Take 2 tablets (40 mg total) by mouth daily with breakfast for 5 days. 10/04/22 10/09/22 Yes Henok Heacock, Janace Aris, MD    Family History Family History  Problem Relation Age of Onset   Diabetes Father     Social History Social History   Tobacco Use   Smoking status: Some Days   Smokeless tobacco: Current    Types: Chew  Vaping Use   Vaping Use: Never used  Substance Use Topics   Alcohol use: Yes    Comment: occasional   Drug use: No     Allergies   Patient has no known allergies.   Review of Systems Review of Systems   Physical Exam Triage Vital Signs ED Triage Vitals [10/04/22 1526]  Enc Vitals Group     BP 136/83     Pulse Rate 84     Resp 18     Temp 98.2 F (36.8 C)     Temp Source Oral     SpO2 99 %     Weight      Height      Head Circumference      Peak Flow      Pain Score      Pain Loc      Pain Edu?      Excl. in GC?    No data found.  Updated Vital Signs BP 136/83 (BP Location: Left Arm)   Pulse 84   Temp 98.2 F (36.8 C) (Oral)   Resp 18   SpO2 99%   Visual Acuity Right Eye  Distance:   Left Eye Distance:   Bilateral Distance:    Right Eye Near:   Left Eye Near:    Bilateral Near:     Physical Exam Vitals reviewed.  Constitutional:      General: He is not in acute distress.    Appearance: He is not ill-appearing, toxic-appearing or diaphoretic.  HENT:     Nose: Nose normal.     Mouth/Throat:     Mouth: Mucous membranes are moist.     Comments: The tonsils are 1-2+ hypertrophied with some clear mucus in the tonsillar crypts and mild erythema.  Dentition is poor Eyes:     Extraocular Movements: Extraocular movements intact.     Conjunctiva/sclera: Conjunctivae normal.     Pupils: Pupils are equal, round, and reactive to light.  Cardiovascular:     Rate and Rhythm: Normal rate and regular rhythm.     Heart sounds: No murmur heard.  Pulmonary:     Effort: Pulmonary effort is normal.     Breath sounds: No stridor. No wheezing, rhonchi or rales.     Comments: His chest is nontender Chest:     Chest wall: No tenderness.  Musculoskeletal:     Cervical back: Neck supple.  Lymphadenopathy:     Cervical: No cervical adenopathy.  Neurological:     General: No focal deficit present.     Mental Status: He is alert and oriented to person, place, and time.  Psychiatric:        Behavior: Behavior normal.      UC Treatments / Results  Labs (all labs ordered are listed, but only abnormal results are displayed) Labs Reviewed  POCT RAPID STREP A, ED / UC    EKG   Radiology DG Chest 2 View  Result Date: 10/04/2022 CLINICAL DATA:  Pleuritic chest pain EXAM: CHEST - 2 VIEW COMPARISON:  06/01/2015 FINDINGS: The heart size and mediastinal contours are within normal limits. Both lungs are clear. The visualized skeletal structures are unremarkable. IMPRESSION: No active cardiopulmonary disease. Electronically Signed   By: Donavan Foil M.D.   On: 10/04/2022 16:21    Procedures Procedures (including critical care time)  Medications Ordered in  UC Medications  ketorolac (TORADOL) 30 MG/ML injection 30 mg (has no administration in time range)    Initial Impression / Assessment and Plan / UC Course  I have reviewed the triage vital signs and the nursing notes.  Pertinent labs & imaging results that were available during my care of the patient were reviewed by me and considered in my medical decision making (see chart for details).       Chest x-ray is negative.  Strep test is negative.  I am going to treat for possible pleurisy with some prednisone for 5 days.  Final Clinical Impressions(s) / UC Diagnoses   Final diagnoses:  Pleurisy  Sore throat     Discharge Instructions      Rapid strep test was negative  Your chest x-ray was clear  You have been given a shot of Toradol 30 mg today.  Take prednisone 20 mg--2 daily for 5 days  At home I would take extra strength Tylenol or 500 mg, 2 every 6 hours as needed for pain     ED Prescriptions     Medication Sig Dispense Auth. Provider   predniSONE (DELTASONE) 20 MG tablet Take 2 tablets (40 mg total) by mouth daily with breakfast for 5 days. 10 tablet Windy Carina Gwenlyn Perking, MD      PDMP not reviewed this encounter.   Barrett Henle, MD 10/04/22 [phone]

## 2022-10-04 NOTE — Discharge Instructions (Addendum)
Rapid strep test was negative  Your chest x-ray was clear  You have been given a shot of Toradol 30 mg today.  Take prednisone 20 mg--2 daily for 5 days  At home I would take extra strength Tylenol or 500 mg, 2 every 6 hours as needed for pain

## 2022-10-04 NOTE — ED Triage Notes (Signed)
Pt reports 2-days of chest pain.Pt reports the pain is worse when he believes in and out. Pt reports throat pain.

## 2023-06-10 ENCOUNTER — Encounter (HOSPITAL_COMMUNITY): Payer: Self-pay | Admitting: Emergency Medicine

## 2023-06-10 ENCOUNTER — Ambulatory Visit (HOSPITAL_COMMUNITY)
Admission: EM | Admit: 2023-06-10 | Discharge: 2023-06-10 | Disposition: A | Discharge status: Home / Self Care | Payer: Self-pay | Attending: Family Medicine | Admitting: Family Medicine

## 2023-06-10 ENCOUNTER — Ambulatory Visit (INDEPENDENT_AMBULATORY_CARE_PROVIDER_SITE_OTHER): Payer: Self-pay

## 2023-06-10 DIAGNOSIS — Z1152 Encounter for screening for COVID-19: Secondary | ICD-10-CM | POA: Insufficient documentation

## 2023-06-10 DIAGNOSIS — B9789 Other viral agents as the cause of diseases classified elsewhere: Secondary | ICD-10-CM | POA: Insufficient documentation

## 2023-06-10 DIAGNOSIS — R091 Pleurisy: Secondary | ICD-10-CM

## 2023-06-10 DIAGNOSIS — J069 Acute upper respiratory infection, unspecified: Secondary | ICD-10-CM

## 2023-06-10 MED ORDER — PREDNISONE 20 MG PO TABS: 40.0000 mg | ORAL_TABLET | Freq: Every day | ORAL | 0 refills | Status: AC

## 2023-06-10 NOTE — ED Provider Notes (Signed)
[facility]    CSN: [id number] Arrival date & time: 06/10/23  1714      History   Chief Complaint Chief Complaint  Patient presents with   Cough    Increase coughing for the past 4 days with left side rib cage pain.    HPI Robert Wolf is a 37 y.o. male.    Cough  Here for some left-sided chest pain and some cough.  About 3 days ago he was at work and had some right-sided chest pain.  He states he took one of the pills leftover from his prescription in October and it got better.  Then he started coughing about 2 days ago and has had some mild nasal congestion.  No sore throat and no fever.  No vomiting.  Also 2 days ago he started hurting in his left chest and it hurts more when he coughs.  It is not bothered more when he takes a deep breath.  In October 2023 he was seen here and I prescribed a burst of prednisone for possible pleurisy at which time his chest x-ray was negative.  History reviewed. No pertinent past medical history.  There are no problems to display for this patient.   History reviewed. No pertinent surgical history.     Home Medications    Prior to Admission medications   Medication Sig Start Date End Date Taking? Authorizing Provider  predniSONE (DELTASONE) 20 MG tablet Take 2 tablets (40 mg total) by mouth daily with breakfast for 5 days. 06/10/23 06/15/23 Yes Kelani Robart, Janace Aris, MD    Family History Family History  Problem Relation Age of Onset   Diabetes Father     Social History Social History   Tobacco Use   Smoking status: Some Days   Smokeless tobacco: Current    Types: Chew  Vaping Use   Vaping Use: Never used  Substance Use Topics   Alcohol use: Yes    Comment: occasional   Drug use: No     Allergies   Patient has no known allergies.   Review of Systems Review of Systems  Respiratory:  Positive for cough.      Physical Exam Triage Vital Signs ED Triage Vitals  Enc Vitals Group     BP 06/10/23 1759  137/85     Pulse Rate 06/10/23 1759 84     Resp 06/10/23 1759 18     Temp 06/10/23 1759 98.7 F (37.1 C)     Temp Source 06/10/23 1759 Oral     SpO2 06/10/23 1759 98 %     Weight 06/10/23 1800 174 lb 2.6 oz (79 kg)     Height 06/10/23 1800 5\' 5"  (1.651 m)     Head Circumference --      Peak Flow --      Pain Score 06/10/23 1800 5     Pain Loc --      Pain Edu? --      Excl. in GC? --    No data found.  Updated Vital Signs BP 137/85 (BP Location: Right Arm)   Pulse 84   Temp 98.7 F (37.1 C) (Oral)   Resp 18   Ht 5\' 5"  (1.651 m)   Wt 79 kg   SpO2 98%   BMI 28.98 kg/m   Visual Acuity Right Eye Distance:   Left Eye Distance:   Bilateral Distance:    Right Eye Near:   Left Eye Near:    Bilateral Near:  Physical Exam Vitals reviewed.  Constitutional:      General: He is not in acute distress.    Appearance: He is not ill-appearing, toxic-appearing or diaphoretic.  HENT:     Nose: Nose normal.     Mouth/Throat:     Mouth: Mucous membranes are moist.     Pharynx: No oropharyngeal exudate or posterior oropharyngeal erythema.  Eyes:     Extraocular Movements: Extraocular movements intact.     Conjunctiva/sclera: Conjunctivae normal.     Pupils: Pupils are equal, round, and reactive to light.  Cardiovascular:     Rate and Rhythm: Normal rate and regular rhythm.     Heart sounds: No murmur heard. Pulmonary:     Effort: Pulmonary effort is normal. No respiratory distress.     Breath sounds: Normal breath sounds. No stridor. No wheezing, rhonchi or rales.  Chest:     Chest wall: No tenderness.  Musculoskeletal:     Cervical back: Neck supple.  Lymphadenopathy:     Cervical: No cervical adenopathy.  Skin:    Coloration: Skin is not jaundiced or pale.  Neurological:     General: No focal deficit present.     Mental Status: He is alert and oriented to person, place, and time.  Psychiatric:        Behavior: Behavior normal.      UC Treatments / Results   Labs (all labs ordered are listed, but only abnormal results are displayed) Labs Reviewed  SARS CORONAVIRUS 2 (TAT 6-24 HRS)    EKG   Radiology No results found.  Procedures Procedures (including critical care time)  Medications Ordered in UC Medications - No data to display  Initial Impression / Assessment and Plan / UC Course  I have reviewed the triage vital signs and the nursing notes.  Pertinent labs & imaging results that were available during my care of the patient were reviewed by me and considered in my medical decision making (see chart for details).        By my review, there is no infiltrate or interstitial fluid on the x-rays.  On the lateral view there appears to be blunting of the right costophrenic angle.  This is not seen on the PA view.  Also x-rays of the ribs did not show any abnormality.  COVID swab was done and if positive he will know if he needs to quarantine.  5-day prednisone burst was prescribed again for possible pleurisy.  He is advised of radiology overread we will call him if there is anything different on the report that would change management or diagnosis. Final Clinical Impressions(s) / UC Diagnoses   Final diagnoses:  Viral URI with cough  Pleurisy     Discharge Instructions      Your x-rays do not show any fluid or pneumonia that I can see.  Also there are no rib fractures.  The radiologist will read your x-rays, and we will call you if there is anything different they see on your report that would change her management or diagnosis.  Take prednisone 20 mg--2 daily for 5 days   You have been swabbed for COVID, and the test will result in the next 24 hours. Our staff will call you if positive. If the COVID test is positive, you should quarantine until you are fever free for 24 hours and you are starting to feel better, and then take added precautions for the next 5 days, such as physical distancing/wearing a mask and good hand  hygiene/washing.  You can use the QR code/website at the back of the summary paperwork to schedule yourself a new patient appointment with primary care      ED Prescriptions     Medication Sig Dispense Auth. Provider   predniSONE (DELTASONE) 20 MG tablet Take 2 tablets (40 mg total) by mouth daily with breakfast for 5 days. 10 tablet Marlinda Mike Janace Aris, MD      PDMP not reviewed this encounter.   Zenia Resides, MD 06/10/23 [phone]

## 2023-06-10 NOTE — Discharge Instructions (Addendum)
Your x-rays do not show any fluid or pneumonia that I can see.  Also there are no rib fractures.  The radiologist will read your x-rays, and we will call you if there is anything different they see on your report that would change her management or diagnosis.  Take prednisone 20 mg--2 daily for 5 days   You have been swabbed for COVID, and the test will result in the next 24 hours. Our staff will call you if positive. If the COVID test is positive, you should quarantine until you are fever free for 24 hours and you are starting to feel better, and then take added precautions for the next 5 days, such as physical distancing/wearing a mask and good hand hygiene/washing.  You can use the QR code/website at the back of the summary paperwork to schedule yourself a new patient appointment with primary care

## 2023-06-10 NOTE — ED Triage Notes (Signed)
Increase coughing for the past 4 days with left side rib cage pain.

## 2023-06-11 LAB — SARS CORONAVIRUS 2 (TAT 6-24 HRS): SARS Coronavirus 2: NEGATIVE
